# Patient Record
Sex: Male | Born: 1985 | Race: White | Hispanic: No | Marital: Single | Smoking: Current every day smoker
Health system: Southern US, Community
[De-identification: ages and names within clinical notes are randomized; demographics above are authoritative.]

## PROBLEM LIST (undated history)

## (undated) DIAGNOSIS — F419 Anxiety disorder, unspecified: Secondary | ICD-10-CM

---

## 2016-06-10 ENCOUNTER — Emergency Department (HOSPITAL_COMMUNITY)
Admission: EM | Admit: 2016-06-10 | Discharge: 2016-06-10 | Disposition: A | Discharge status: Home / Self Care | Payer: Self-pay | Attending: Emergency Medicine | Admitting: Emergency Medicine

## 2016-06-10 ENCOUNTER — Encounter (HOSPITAL_COMMUNITY): Payer: Self-pay

## 2016-06-10 ENCOUNTER — Emergency Department (HOSPITAL_COMMUNITY): Payer: Self-pay

## 2016-06-10 DIAGNOSIS — F410 Panic disorder [episodic paroxysmal anxiety] without agoraphobia: Secondary | ICD-10-CM | POA: Insufficient documentation

## 2016-06-10 DIAGNOSIS — R072 Precordial pain: Secondary | ICD-10-CM | POA: Insufficient documentation

## 2016-06-10 DIAGNOSIS — F419 Anxiety disorder, unspecified: Secondary | ICD-10-CM

## 2016-06-10 DIAGNOSIS — R079 Chest pain, unspecified: Secondary | ICD-10-CM

## 2016-06-10 DIAGNOSIS — F17200 Nicotine dependence, unspecified, uncomplicated: Secondary | ICD-10-CM | POA: Insufficient documentation

## 2016-06-10 HISTORY — DX: Anxiety disorder, unspecified: F41.9

## 2016-06-10 LAB — BASIC METABOLIC PANEL
Anion gap: 9 (ref 5–15)
BUN: 9 mg/dL (ref 6–20)
CHLORIDE: 106 mmol/L (ref 101–111)
CO2: 25 mmol/L (ref 22–32)
CREATININE: 0.86 mg/dL (ref 0.61–1.24)
Calcium: 9.4 mg/dL (ref 8.9–10.3)
GFR calc Af Amer: >60 mL/min (ref >60)
GFR calc non Af Amer: >60 mL/min (ref >60)
Glucose, Bld: 93 mg/dL (ref 65–99)
POTASSIUM: 4.2 mmol/L (ref 3.5–5.1)
SODIUM: 140 mmol/L (ref 135–145)

## 2016-06-10 LAB — CBC
HEMATOCRIT: 43.7 % (ref 39.0–52.0)
Hemoglobin: 15.7 g/dL (ref 13.0–17.0)
MCH: 29.5 pg (ref 26.0–34.0)
MCHC: 35.9 g/dL (ref 30.0–36.0)
MCV: 82 fL (ref 78.0–100.0)
PLATELETS: 291 10*3/uL (ref 150–400)
RBC: 5.33 MIL/uL (ref 4.22–5.81)
RDW: 12.7 % (ref 11.5–15.5)
WBC: 9.2 10*3/uL (ref 4.0–10.5)

## 2016-06-10 LAB — I-STAT TROPONIN, ED: Troponin i, poc: 0 ng/mL (ref 0.00–0.08)

## 2016-06-10 LAB — D-DIMER, QUANTITATIVE (NOT AT ARMC)

## 2016-06-10 MED ORDER — OMEPRAZOLE 20 MG PO CPDR: 20.0000 mg | DELAYED_RELEASE_CAPSULE | Freq: Every day | ORAL | 0 refills | Status: AC

## 2016-06-10 MED ORDER — MECLIZINE HCL 25 MG PO TABS
25.0000 mg | ORAL_TABLET | Freq: Once | ORAL | Status: AC
  Administered 2016-06-10: 25 mg via ORAL
  Filled 2016-06-10: qty 1

## 2016-06-10 MED ORDER — HYDROXYZINE HCL 10 MG PO TABS: 10.0000 mg | ORAL_TABLET | Freq: Four times a day (QID) | ORAL | 0 refills | Status: AC | PRN

## 2016-06-10 NOTE — Discharge Instructions (Signed)
Substance Abuse Treatment Programs ° °Intensive Outpatient Programs °[facility]     °[address]      °[place], [place]                   °[phone]      ° °[facility] °[address] °[place], [place] °[phone] ° °[facility]     °(Inpatient and outpatient)     °[address]           °[phone]   ° °[facility] °336-288-1484 (Suboxone and Methadone) ° °[address]      °[place], [place] [address]      °336-882-2125      ° °[address] °[place], [place] °852-3033 ° °[facility] (Outpatient/Inpatient, Chemical)    °(insurance only) [phone]      °       °[facility] (Groups & Residential) °[place], [place] °336-389-1413 ° °   °[facility]     °[address]     °[place], [place]      °[phone]      ° °[facility] (for caregivers and family) °[address] °[place], [place] °336-299-4655 ° ° ° ° ° °Residential Treatment Programs °[facility]      °[address], [place], [place] [address]  °([phone]      ° °[facility] °[address] [place], [place] [address] °[phone] ° °[facility]        °336-248-8914      ° °[facility] °1-800-659-3381 ° °[facility]             °[address]                                         °[place], [place]                                                °877-615-2722 or 336-784-9470                              ° °[facility] [place] °[address] °[place], [address] °1.877.941.8954 ° °[facility]    °[address]      °[place]     °[phone]      ° °[facility] °[address] °[place], [place] °336-285-9073 ° °[facility]   °[address]     °[place], [place] [address]     °336-899-1550      °Admissions: 8am-3pm M-F ° °[facility] °[address] °[place],  [place] °336-227-7417 ° °[facility]: Residential Program (90 Days)   °[place], [place]      °336-725-8389 or 800-758-6077    ° °[facility] °[place], [place] °(Walk in Hours over the weekend or by referral) ° °[facility] °[address], [place], [place] [address] °(336) [phone] ° °Crisis Mobile: [facility]:  1-877-626-1772 (for crisis response 24 hours a day) °[facility] Hotline:      1-800-256-2452 °Outpatient Psychiatry and Counseling ° °[facility]: Mobile Crisis   Management 24 hours:  1-877-626-1772 ° °[facility] sliding scale fee and walk in schedule: M-F 8am-12pm/1pm-3pm °[address]  °[place], [place] [address] °[phone] ° °[facility] °[address] °[place], [place] [address] °336-703-9650 ° °[facility] (Former[facility] known [facility])- new patient walk-in appointments available Monday - Friday 8am -3pm.          °[address] °[place], [place] [address] °336-676-6840 or crisis line- 336-676-6905 ° °[facility]/ Intensive Outpatient Therapy Program °[address] °[place], [place] [address] °336-832-9804 ° °[facility]                  °Crisis Services      °336.641.4993      °[address]     °[place], [place] [address]                ° °[facility]   °[facility] °[phone] °[address] °[place], [place] [address] ° ° °[facility]          °[address]  °[place], [place] [address]       °([phone] ° °[facility] °[address] °[place], [place] [address] °336-292-1510 ° °[facility]    °[address]    °[place], [place] [address]     °336-632-3505      ° °Parish McKinney, MD     °[address]     °[place] [address]     °336-282-1251     °  °[facility] °[address] °[place] [address] ° °[facility]     °[address]     °[place], [place]      °336-542-2076      ° °[facility] °Shamsher Ahluwalia, MD °[address] °[place], [place] [address] °336-420-9558 ° °[facility]     °[address]     °[place], [place] [address]     °336-274-1237      ° °[facility] °[address] °[place], [place] [address] °336-854-4450 °Resources for Temporary Residential Assistance/Crisis Centers ° °DAY CENTERS °[facility] °M-F 8am-3pm   °[address] [place], [place] [address]   336-332-0824 °Services include: laundry, barbering, support groups, case management, phone  & computer access, showers, AA/NA mtgs, mental health/substance abuse nurse, job skills class, disability information, VA assistance, spiritual classes, etc.  ° °HOMELESS SHELTERS ° °[facility]     °[facility]   °[address], [place]     °336.271.5959       °       °[facility] (women and children)       °[address] °[place], [place] [address] °336-275-0820 °[email or web address] for application and process °Application Required ° °[facility]   °[address]    °[place] [address]     °336.886.4922       °             °[facility] °[address] °, [place] [address] °336.273.5572 °336-235-0363(schedule application appt.) °Application Required ° °[facility] (women only)    °[address]     °[place],  [address]     °336-884-1039      °  Intake starts 6pm daily °Need valid ID, SSC, & Police report °[facility] [place] °[address] °[place], [place] °336-881-5420 °Application Required ° °[facility] (men only)     °[address]      °[place], [place]     °[phone]      ° °[facility] °(Pregnant women only) °[address] °[place], [place] °336-275-0206 ° °[facility]      °[address]      °[place], [place] [address]     °[phone]      °       °[facility] °[address] °[place], [place] °336-723-1848 °90 day commitment/SA/Application process ° °[facility](men only)     °[address]     °[place], [place]     °336-748-1962       °Check-in at 7pm     °       °[facility] °[address] °[place], [place] [address] °336-248-6684 °Men/Women/Women and Children must be there by 7 pm ° °[facility] °[place], [place] °336-722-8721                ° °

## 2016-06-10 NOTE — ED Triage Notes (Addendum)
Per EMS, pt from home with complaint of left sided nonradiating CP x 3 days that is intermittent. Pt went to [facility] for [facility] yesterday, was discharged and everything was normal. 12 lead NSR. VSS. Does not appear to be anxious.  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

## 2016-06-10 NOTE — ED Provider Notes (Signed)
[facility]-EM[facility] DEPT Provider Note   CSN: [id number] Arrival date & time: 06/10/16  2032     History   Chief Complaint Chief Complaint  Patient presents with  . Panic Attack    HPI Luis Lawrence is a 30 y.o. male.  Luis Lawrence is a 30 y.o. Male who presents to the emergency department complaining of a panic attack. I just saw on discharge as patient earlier today for chest pain. Patient reports he was brought to [name] by ambulance but is staying in [name]. He is provided with a bus pass but the bus that takes him to [name] will not come until the morning. He reports that he began feeling very anxious and check back in. It appears he did the exact same thing at [name] yesterday. Yesterday the patient reported [place] [facility] that he was having suicidal ideations. He had no plan. He was evaluated by psychiatry who felt he was stating this for secondary gain and he was discharged with follow-up with behavioral health. Today the patient only complains of anxiety. He denies any suicidal or homicidal ideations. He denies visual or auditory hallucinations. He denies attempts to harm himself. Patient denies fevers, chest pain, shortness of breath, vomiting, diarrhea, rashes, SI or HI.    The history is provided by the patient and medical records. No language interpreter was used.    Past Medical History:  Diagnosis Date  . Anxiety     There are no active problems to display for this patient.   History reviewed. No pertinent surgical history.     Home Medications    Prior to Admission medications   Medication Sig Start Date End Date Taking? Authorizing Provider  hydrOXYzine (ATARAX/VISTARIL) 10 MG tablet Take 1 tablet (10 mg total) by mouth every 6 (six) hours as needed for anxiety. 06/10/16   [name], PA-C  omeprazole (PRILOSEC) 20 MG capsule Take 1 capsule (20 mg total) by mouth daily. 06/10/16   [name], PA-C    Family History No  family history on file.  Social History Social History  Substance Use Topics  . Smoking status: Current Every Day Smoker    Packs/day: 1.00  . Smokeless tobacco: Not on file  . Alcohol use Yes     Comment: occasionally     Allergies   Ibuprofen   Review of Systems Review of Systems  Constitutional: Negative for chills and fever.  HENT: Negative for congestion and sore throat.   Eyes: Negative for visual disturbance.  Respiratory: Negative for cough and shortness of breath.   Cardiovascular: Negative for chest pain.  Gastrointestinal: Negative for abdominal pain, nausea and vomiting.  Genitourinary: Negative for dysuria.  Musculoskeletal: Negative for neck pain.  Skin: Negative for rash.  Neurological: Negative for headaches.  Psychiatric/Behavioral: Negative for dysphoric mood, hallucinations, self-injury and suicidal ideas. The patient is nervous/anxious.      Physical Exam Updated Vital Signs BP 112/84 (BP Location: Left Arm)   Pulse 62   Temp 98.5 F (36.9 C) (Oral)   Resp 18   Ht 5\' 9"  (1.753 m)   Wt 75 kg   SpO2 97%   BMI 24.43 kg/m   Physical Exam  Constitutional: He appears well-developed and well-nourished. No distress.  HENT:  Head: Normocephalic and atraumatic.  Eyes: Right eye exhibits no discharge. Left eye exhibits no discharge.  Pulmonary/Chest: Effort normal. No respiratory distress.  Neurological: He is alert. Coordination normal.  Skin: No rash noted. He is not  diaphoretic.  Psychiatric: His behavior is normal. His speech is not rapid and/or pressured. He does not exhibit a depressed mood. He expresses no homicidal and no suicidal ideation.  The patient does not appear anxious or depressed. He is resting comfortably in the bed watching TV. He endorses feeling anxious. He denies suicidal or homicidal ideations. He denies visual or auditory hallucinations. He is not disheveled.  Nursing note and vitals reviewed.    ED Treatments / Results    Labs (all labs ordered are listed, but only abnormal results are displayed) Labs Reviewed - No data to display  EKG  EKG Interpretation None       Radiology Dg Chest 2 View  Result Date: 06/10/2016 CLINICAL DATA:  Left sided chest pain and shortness of breath for several days EXAM: CHEST  2 VIEW COMPARISON:  06/09/2016 FINDINGS: The heart size and mediastinal contours are within normal limits. Both lungs are clear. The visualized skeletal structures are unremarkable. IMPRESSION: No active cardiopulmonary disease. Electronically Signed   By: Alcide CleverMark  Lukens M.D.   On: 06/10/2016 18:45    Procedures Procedures (including critical care time)  Medications Ordered in ED Medications  meclizine (ANTIVERT) tablet 25 mg (not administered)     Initial Impression / Assessment and Plan / ED Course  I have reviewed the triage vital signs and the nursing notes.  Pertinent labs & imaging results that were available during my care of the patient were reviewed by me and considered in my medical decision making (see chart for details).  Clinical Course   This is a 30 y.o. Male who presents to the emergency department complaining of a panic attack. I just saw on discharge as patient earlier today for chest pain. Patient [facility] [place] [facility] [place] [facility]. He is provided with a bus pass but [facility] will not come until the morning. He reports that he began feeling very anxious and check back in. It appears he [facility] the exact [facility]. Yesterday the patient reported [facility] that he was having suicidal ideations. He had no plan. He was evaluated by psychiatry who felt he was stating this for secondary gain and he was discharged with follow-up with behavioral health. Today the patient only complains of anxiety. He denies any suicidal or homicidal ideations. He denies visual or auditory  hallucinations. On exam patient is afebrile nontoxic appearing. He endorses anxiety. He does not appear depressed or anxious. He is not disheveled. He denies suicidal or homicidal ideations. Will provide him with some Atarax and discharge him with a short course of Atarax. I encouraged him to follow-up with behavioral health. I provided reassurance. We'll discharge at this time. I advised the patient to follow-up with their primary care provider this week. I advised the patient to return to the emergency department with new or worsening symptoms or new concerns. The patient verbalized understanding and agreement with plan.    Final Clinical Impressions(s) / ED Diagnoses   Final diagnoses:  Panic attack  Anxiety    New Prescriptions New Prescriptions   HYDROXYZINE (ATARAX/VISTARIL) 10 MG TABLET    Take 1 tablet (10 mg total) by mouth every 6 (six) hours as needed for anxiety.     Everlene FarrierWilliam Carmeron Heady, PA-C 06/10/16 [id number]    Arby BarretteMarcy Pfeiffer, MD 06/11/16 [phone]

## 2016-06-10 NOTE — ED Notes (Signed)
Patient undressed, in gown, on monitor, continuous pulse oximetry and blood pressure cuff 

## 2016-06-10 NOTE — ED Notes (Signed)
Called Pt. No response. 

## 2016-06-10 NOTE — ED Notes (Signed)
Pt showing NAD. RR even and unlabored. Voices no questions/concerns at this time. 

## 2016-06-10 NOTE — ED Provider Notes (Signed)
[facility] Provider Note   CSN: [id number] Arrival date & time: 06/10/16  1726     History   Chief Complaint Chief Complaint  Patient presents with  . Chest Pain    HPI Luis Mullethomas Lawrence is a 30 y.o. male.  Luis Mullethomas Hisaw is a 30 y.o. Male who presents to the emergency department complaining of substernal nonradiating chest pain intermittently for the past 3 days. He reports he will just randomly have pain that'll last minutes to seconds and then resolve on their own. It is not worse with exertion. He also reports he's had some intermittent epigastric pain. He denies nausea, vomiting or diarrhea. No previous abdominal surgeries. He reports that he is feeling hungry and would like something to eat. On care everywhere I see [facility] and had negative troponin and delta troponin. He was concerned is experiencing some bilateral foot swelling after a [employer] bus ride from [place]. This has since resolved. Patient denies history of MI, PE or DVT. He is a current smoker. He denies history of hypertension or hyperlipidemia. Patient denies fevers, hemoptysis, nausea, vomiting, diarrhea, rashes, palpitations, leg pain, leg swelling, lightheadedness, dizziness or syncope.   The history is provided by the patient and medical records. No language interpreter was used.  Chest Pain   Associated symptoms include abdominal pain. Pertinent negatives include no back pain, no cough, no dizziness, no fever, no headaches, no nausea, no palpitations, no shortness of breath and no vomiting.    History reviewed. No pertinent past medical history.  There are no active problems to display for this patient.   History reviewed. No pertinent surgical history.     Home Medications    Prior to Admission medications   Medication Sig Start Date End Date Taking? Authorizing Provider  omeprazole (PRILOSEC) 20 MG capsule Take 1 capsule (20 mg total) by mouth daily.  06/10/16   Everlene FarrierWilliam Katye Valek, PA-C    Family History History reviewed. No pertinent family history.  Social History Social History  Substance Use Topics  . Smoking status: Current Every Day Smoker    Packs/day: 1.00  . Smokeless tobacco: Not on file  . Alcohol use Yes     Comment: occasionally     Allergies   Ibuprofen   Review of Systems Review of Systems  Constitutional: Negative for chills and fever.  HENT: Negative for congestion and sore throat.   Eyes: Negative for visual disturbance.  Respiratory: Negative for cough, shortness of breath and wheezing.   Cardiovascular: Positive for chest pain. Negative for palpitations and leg swelling.  Gastrointestinal: Positive for abdominal pain. Negative for diarrhea, nausea and vomiting.  Genitourinary: Negative for dysuria.  Musculoskeletal: Negative for back pain and neck pain.  Skin: Negative for rash.  Neurological: Negative for dizziness, syncope, light-headedness and headaches.     Physical Exam Updated Vital Signs BP 110/73   Pulse 64   Temp 98.3 F (36.8 C) (Oral)   Resp 16   Ht 5\' 9"  (1.753 m)   Wt 77.1 kg   SpO2 98%   BMI 25.10 kg/m   Physical Exam  Constitutional: He is oriented to person, place, and time. He appears well-developed and well-nourished. No distress.  Nontoxic appearing.  HENT:  Head: Normocephalic and atraumatic.  Mouth/Throat: Oropharynx is clear and moist.  Eyes: Conjunctivae are normal. Pupils are equal, round, and reactive to light. Right eye exhibits no discharge. Left eye exhibits no discharge.  Neck: Normal range of motion. Neck  supple. No JVD present.  Cardiovascular: Normal rate, regular rhythm, normal heart sounds and intact distal pulses.  Exam reveals no gallop and no friction rub.   No murmur heard. Bilateral radial, posterior tibialis and dorsalis pedis pulses are intact.    Pulmonary/Chest: Effort normal and breath sounds normal. No respiratory distress. He has no wheezes.  He has no rales. He exhibits no tenderness.  Lungs clear to auscultation bilaterally. No chest wall tenderness to palpation. Symmetric chest expansion bilaterally.  Abdominal: Soft. Bowel sounds are normal. He exhibits no distension and no mass. There is tenderness. There is no guarding.  Abdomen is soft. Bowel sounds are present. Patient has mild epigastric tenderness to palpation. No peritoneal signs.  Musculoskeletal: He exhibits no edema or tenderness.  No lower extremity edema or tenderness.  Lymphadenopathy:    He has no cervical adenopathy.  Neurological: He is alert and oriented to person, place, and time. Coordination normal.  Skin: Skin is warm and dry. Capillary refill takes less than 2 seconds. No rash noted. He is not diaphoretic. No erythema. No pallor.  Psychiatric: He has a normal mood and affect. His behavior is normal.  Nursing note and vitals reviewed.    ED Treatments / Results  Labs (all labs ordered are listed, but only abnormal results are displayed) Labs Reviewed  BASIC METABOLIC PANEL  CBC  D-DIMER, QUANTITATIVE (NOT AT [facility])  Rosezena Sensor, ED    EKG  EKG Interpretation  Date/Time:  Wednesday June 10 2016 17:36:08 EDT Ventricular Rate:  76 PR Interval:    QRS Duration: 94 QT Interval:  356 QTC Calculation: 401 R Axis:   77 Text Interpretation:  Sinus rhythm ST elev, probable normal early repol pattern agree. no STEMI Confirmed by Donnald Garre, MD, Lebron Conners [phone]) on 06/10/2016 7:11:40 PM       Radiology Dg Chest 2 View  Result Date: 06/10/2016 CLINICAL DATA:  Left sided chest pain and shortness of breath for several days EXAM: CHEST  2 VIEW COMPARISON:  06/09/2016 FINDINGS: The heart size and mediastinal contours are within normal limits. Both lungs are clear. The visualized skeletal structures are unremarkable. IMPRESSION: No active cardiopulmonary disease. Electronically Signed   By: Alcide Clever M.D.   On: 06/10/2016 18:45     Procedures Procedures (including critical care time)  Medications Ordered in ED Medications - No data to display   Initial Impression / Assessment and Plan / ED Course  I have reviewed the triage vital signs and the nursing notes.  Pertinent labs & imaging results that were available during my care of the patient were reviewed by me and considered in my medical decision making (see chart for details).  Clinical Course    This is a 30 y.o. Male who presents to the emergency department complaining of substernal nonradiating chest pain intermittently for the past 3 days. He reports he will just randomly have pain that'll last minutes to seconds and then resolve on their own. It is not worse with exertion. He also reports he's had some intermittent epigastric pain. He denies nausea, vomiting or diarrhea. No previous abdominal surgeries. He reports that he is feeling hungry and would like something to eat. On care everywhere I see he was seen at [facility] yesterday and had negative troponin and delta troponin. He was concerned is experiencing some bilateral foot swelling after a [employer] bus ride from [place]. This has since resolved. Patient denies history of MI, PE or DVT. Patient is to be discharged with  recommendation to follow up with PCP in regards to today's hospital visit. Chest pain is not likely of cardiac or pulmonary etiology d/t presentation, perc negative, VSS, no tracheal deviation, no JVD or new murmur, RRR, breath sounds equal bilaterally, EKG without acute abnormalities, negative troponin, negative d-dimer, and negative CXR. I also see had a negative troponin yesterday at [place] [facility]. Patient has tolerated 2 sandwiches without nausea, vomiting or abdominal pain. He reports feeling better. No active chest pain. Pt has been advised start a PPI and return to the ED is CP becomes exertional, associated with diaphoresis or nausea, radiates to left jaw/arm, worsens  or becomes concerning in any way. Pt appears reliable for follow up and is agreeable to discharge. I provided with information for follow-up with the [facility].    Final Clinical Impressions(s) / ED Diagnoses   Final diagnoses:  Nonspecific chest pain    New Prescriptions New Prescriptions   OMEPRAZOLE (PRILOSEC) 20 MG CAPSULE    Take 1 capsule (20 mg total) by mouth daily.     [name], PA-C 06/10/16 1947    Arby Barrette, MD 06/11/16 [phone]

## 2016-06-10 NOTE — ED Triage Notes (Signed)
Pt just discharged an hour ago; pt states he is still having panic attack; no new symtoms noted; pt a&ox 4 on arrival. Pt does not appear anxious in triage; pt sitting quietly on using phone

## 2016-06-11 ENCOUNTER — Encounter (HOSPITAL_COMMUNITY): Payer: Self-pay | Admitting: *Deleted

## 2016-06-11 ENCOUNTER — Emergency Department (HOSPITAL_COMMUNITY)
Admission: EM | Admit: 2016-06-11 | Discharge: 2016-06-12 | Disposition: A | Discharge status: Home / Self Care | Payer: Self-pay | Attending: Emergency Medicine | Admitting: Emergency Medicine

## 2016-06-11 DIAGNOSIS — F1910 Other psychoactive substance abuse, uncomplicated: Secondary | ICD-10-CM | POA: Insufficient documentation

## 2016-06-11 DIAGNOSIS — R45851 Suicidal ideations: Secondary | ICD-10-CM

## 2016-06-11 DIAGNOSIS — F17200 Nicotine dependence, unspecified, uncomplicated: Secondary | ICD-10-CM | POA: Insufficient documentation

## 2016-06-11 DIAGNOSIS — Z79899 Other long term (current) drug therapy: Secondary | ICD-10-CM | POA: Insufficient documentation

## 2016-06-11 DIAGNOSIS — F331 Major depressive disorder, recurrent, moderate: Secondary | ICD-10-CM | POA: Diagnosis present

## 2016-06-11 LAB — RAPID URINE DRUG SCREEN, HOSP PERFORMED
Amphetamines: NOT DETECTED
Barbiturates: NOT DETECTED
Benzodiazepines: NOT DETECTED
Cocaine: NOT DETECTED
Opiates: NOT DETECTED
Tetrahydrocannabinol: NOT DETECTED

## 2016-06-11 LAB — HEPATIC FUNCTION PANEL
ALT: 16 U/L — ABNORMAL LOW (ref 17–63)
AST: 14 U/L — ABNORMAL LOW (ref 15–41)
Albumin: 3.6 g/dL (ref 3.5–5.0)
Alkaline Phosphatase: 72 U/L (ref 38–126)
Bilirubin, Direct: <0.1 mg/dL — ABNORMAL LOW (ref 0.1–0.5)
Total Bilirubin: 0.6 mg/dL (ref 0.3–1.2)
Total Protein: 6.3 g/dL — ABNORMAL LOW (ref 6.5–8.1)

## 2016-06-11 LAB — SALICYLATE LEVEL: Salicylate Lvl: <7 mg/dL (ref 2.8–30.0)

## 2016-06-11 LAB — ACETAMINOPHEN LEVEL

## 2016-06-11 LAB — ETHANOL: Alcohol, Ethyl (B): <5 mg/dL (ref <5)

## 2016-06-11 MED ORDER — ACETAMINOPHEN 325 MG PO TABS: 650.0000 mg | ORAL_TABLET | ORAL | Status: DC | PRN

## 2016-06-11 MED ORDER — LORAZEPAM 1 MG PO TABS: 1.0000 mg | ORAL_TABLET | Freq: Three times a day (TID) | ORAL | Status: DC | PRN

## 2016-06-11 NOTE — BH Assessment (Addendum)
Tele Assessment Note   Luis Lawrence is an 30 y.o. male that denies SI/HI/Pychosis/Substance Abuse.  Patient is a poor historian and he refuses to speaking during the assessment.  Patient would only state that he wants to go home because he does not want to harm himself, others and he is not hearing or seeing things that no one else can hear or see.   Per documentation in the epic chart the patient stated that he wanted to ill himself.  Diagnosis: Major Depressive Diosrder  Past Medical History:  Past Medical History:  Diagnosis Date  . Anxiety     History reviewed. No pertinent surgical history.  Family History: No family history on file.  Social History:  reports that he has been smoking.  He has been smoking about 1.00 pack per day. He has never used smokeless tobacco. He reports that he drinks alcohol. He reports that he does not use drugs.  Additional Social History:  Alcohol / Drug Use History of alcohol / drug use?: No history of alcohol / drug abuse  CIWA: CIWA-Ar BP: (!) 91/53 Pulse Rate: (!) 55 COWS:    PATIENT STRENGTHS: (choose at least two) Average or above average intelligence Capable of independent living Communication skills Physical Health Supportive family/friends  Allergies:  Allergies  Allergen Reactions  . Ibuprofen Nausea And Vomiting    Home Medications:  (Not in a hospital admission)  OB/GYN Status:  No LMP for male patient.  General Assessment Data Location of Assessment: [facility] ED TTS Assessment: In system Is this a Tele or Face-to-Face Assessment?: Tele Assessment Is this an Initial Assessment or a Re-assessment for this encounter?: Initial Assessment Marital status: Single Maiden name: NA Is patient pregnant?: No Pregnancy Status: No Living Arrangements:  (Lives in a room ) Can pt return to current living arrangement?: Yes Admission Status: Voluntary Is patient capable of signing voluntary admission?: Yes Referral Source:  Self/Family/Friend Insurance type: Self Pay  Medical Screening Exam [facility] Walk-in ONLY) Medical Exam completed:  (NA)  Crisis Care Plan Living Arrangements:  (Lives in a room ) Legal Guardian:  (NA) Name of Psychiatrist: Refused to answer the question  Name of Therapist: Refused to answer the question   Education Status Is patient currently in school?: No Current Grade: NA Highest grade of school patient has completed: NA Name of school: NA Contact person: NA  Risk to self with the past 6 months Suicidal Ideation: No Has patient been a risk to self within the past 6 months prior to admission? : No Suicidal Intent: No Has patient had any suicidal intent within the past 6 months prior to admission? : No Is patient at risk for suicide?: No Suicidal Plan?: No-Not Currently/Within Last 6 Months Has patient had any suicidal plan within the past 6 months prior to admission? : No Access to Means: No What has been your use of drugs/alcohol within the last 12 months?: NA Previous Attempts/Gestures: No How many times?: 0 Other Self Harm Risks: NA Triggers for Past Attempts:  (NA) Intentional Self Injurious Behavior: None Family Suicide History: No Recent stressful life event(s):  (None Reported) Persecutory voices/beliefs?: No Depression: Yes Depression Symptoms: Despondent, Feeling angry/irritable, Feeling worthless/self pity Substance abuse history and/or treatment for substance abuse?: No Suicide prevention information given to non-admitted patients: Not applicable  Risk to Others within the past 6 months Homicidal Ideation: No Does patient have any lifetime risk of violence toward others beyond the six months prior to admission? : No Thoughts of Harm  to Others: No Current Homicidal Intent: No Current Homicidal Plan: No Access to Homicidal Means: No Identified Victim: NA History of harm to others?: No Assessment of Violence: None Noted Violent Behavior Description: NA Does  patient have access to weapons?: No Criminal Charges Pending?: No Does patient have a court date: No Is patient on probation?: No  Psychosis Hallucinations: None noted Delusions: None noted  Mental Status Report Appearance/Hygiene: Disheveled Eye Contact: Good Motor Activity: Freedom of movement Speech: Logical/coherent Level of Consciousness: Alert Mood: Depressed, Anxious, Suspicious Affect: Anxious, Depressed, Blunted Anxiety Level: Minimal Thought Processes: Coherent, Relevant Judgement: Unimpaired Orientation: Person, Place, Time, Situation Obsessive Compulsive Thoughts/Behaviors: None  Cognitive Functioning Concentration: Decreased Memory: Recent Intact, Remote Intact IQ: Average Insight: Fair Impulse Control: Fair Appetite: Fair Weight Loss: 0 Weight Gain: 0 Sleep: Decreased Total Hours of Sleep: 7 Vegetative Symptoms: Decreased grooming  ADLScreening [facility] Patient's cognitive ability adequate to safely complete daily activities?: Yes Patient able to express need for assistance with ADLs?: Yes Independently performs ADLs?: Yes (appropriate for developmental age)  Prior Inpatient Therapy Prior Inpatient Therapy: No Prior Therapy Dates: NA Prior Therapy Facilty/Provider(s): NA Reason for Treatment: NA  Prior Outpatient Therapy Prior Outpatient Therapy: No Prior Therapy Dates: NA Prior Therapy Facilty/Provider(s): NA Reason for Treatment: NA Does patient have an ACCT team?: No Does patient have Intensive In-House Services?  : No Does patient have [facility] services? : No Does patient have P4CC services?: No  ADL Screening (condition at time of admission) Patient's cognitive ability adequate to safely complete daily activities?: Yes Is the patient deaf or have difficulty hearing?: No Does the patient have difficulty seeing, even when wearing glasses/contacts?: No Does the patient have difficulty concentrating, remembering, or making  decisions?: No Patient able to express need for assistance with ADLs?: Yes Does the patient have difficulty dressing or bathing?: No Independently performs ADLs?: Yes (appropriate for developmental age) Does the patient have difficulty walking or climbing stairs?: No Weakness of Legs: None Weakness of Arms/Hands: None  Home Assistive Devices/Equipment Home Assistive Devices/Equipment: None    Abuse/Neglect Assessment (Assessment to be complete while patient is alone) Physical Abuse: Denies Verbal Abuse: Denies Sexual Abuse: Denies Exploitation of patient/patient's resources: Denies Self-Neglect: Denies Values / Beliefs Cultural Requests During Hospitalization: None Spiritual Requests During Hospitalization: None Consults Spiritual Care Consult Needed: No Social Work Consult Needed: No Merchant navy officerAdvance Directives (For Healthcare) Does patient have an advance directive?: No Would patient like information on creating an advanced directive?: No - patient declined information    Additional Information 1:1 In Past 12 Months?: No CIRT Risk: No Elopement Risk: No Does patient have medical clearance?: No     Disposition: Per Renata Capriceonrad, NP - patient meets criteria for OBS Unit.  Disposition Initial Assessment Completed for this Encounter: Yes Disposition of Patient: Other dispositions  Linton RumpStevenson, Rivers Hamrick LaVerne 06/11/2016 10:57 AM

## 2016-06-11 NOTE — ED Notes (Signed)
Had pt dress out in purple scrubs with belongings placed in bag.

## 2016-06-11 NOTE — ED Triage Notes (Signed)
Pt reports suicidal ideation onset yesterday with plan to "walk out in front of traffic when I leave here." Pt has been seen twice in the past twelve hours for anxiety and was asleep in the lobby prior to check in this time.

## 2016-06-11 NOTE — ED Provider Notes (Addendum)
  Physical Exam  BP 99/65   Pulse 61   Temp 98.2 F (36.8 C) (Oral)   Resp 16   SpO2 100%   Physical Exam  ED Course  Procedures  MDM Patient initially refusing TTS evaluation. Reviewed records and he has repeatedly stated suicidal thoughts. There was question of secondary gain 20 [facility] or the day before for this. At this point I think with the repeated statements he's making he needs psychiatric evaluation. Patient states he is willing to stay to get it otherwise he'll be involuntary committed.       Luis CoreNathan Miguelangel Korn, MD 06/11/16 [phone]  [facility] Next the patient be a good candidate for observation. However patient is not willing to go over there and he would require involuntary commitment and patient could not go to [facility] if he is involuntary committed. States he wants to be seen and cleared here.    Luis CoreNathan Marli Diego, MD 06/11/16 1021

## 2016-06-11 NOTE — ED Notes (Signed)
Pt given coke and snack at this time.  

## 2016-06-11 NOTE — ED Notes (Addendum)
TTS in process, pt agitated

## 2016-06-11 NOTE — ED Provider Notes (Signed)
[facility]-EMERGENCY DEPT Provider Note   CSN: [id number] Arrival date & time: 06/11/16  0423     History   Chief Complaint Chief Complaint  Patient presents with  . Suicidal    HPI Luis Lawrence is a 30 y.o. male.  The history is provided by the patient.  Mental Health Problem  Presenting symptoms: depression and suicidal thoughts   Degree of incapacity (severity):  Moderate Onset quality:  Gradual Timing:  Constant Progression:  Worsening Chronicity:  New Context: not drug abuse   Relieved by:  Nothing Worsened by:  Nothing Associated symptoms: no abdominal pain, no chest pain and no headaches   Patient is here for suicidal thoughts He reports he has a plan to walk out into traffic He reports he feels depressed He has no other complaints He denies ETOH/drug abuse He denies HA/CP/abdominal pain   Past Medical History:  Diagnosis Date  . Anxiety     There are no active problems to display for this patient.   History reviewed. No pertinent surgical history.     Home Medications    Prior to Admission medications   Medication Sig Start Date End Date Taking? Authorizing Provider  hydrOXYzine (ATARAX/VISTARIL) 10 MG tablet Take 1 tablet (10 mg total) by mouth every 6 (six) hours as needed for anxiety. 06/10/16   [name], PA-C  omeprazole (PRILOSEC) 20 MG capsule Take 1 capsule (20 mg total) by mouth daily. 06/10/16   [name], PA-C    Family History No family history on file.  Social History Social History  Substance Use Topics  . Smoking status: Current Every Day Smoker    Packs/day: 1.00  . Smokeless tobacco: Never Used  . Alcohol use Yes     Comment: occasionally     Allergies   Ibuprofen   Review of Systems Review of Systems  Constitutional: Negative for fever.  Cardiovascular: Negative for chest pain.  Gastrointestinal: Negative for abdominal pain.  Neurological: Negative for headaches.  Psychiatric/Behavioral: Positive for  suicidal ideas.  All other systems reviewed and are negative.    Physical Exam Updated Vital Signs BP 116/75   Pulse (!) 52   Temp 98.2 F (36.8 C) (Oral)   Resp 16   SpO2 98%   Physical Exam CONSTITUTIONAL: Disheveled HEAD: Normocephalic/atraumatic EYES: EOMI/PERRL ENMT: Mucous membranes moist NECK: supple no meningeal signs SPINE/BACK:entire spine nontender CV: S1/S2 noted, no murmurs/rubs/gallops noted LUNGS: Lungs are clear to auscultation bilaterally, no apparent distress ABDOMEN: soft, nontender NEURO: Pt is awake/alert/appropriate, moves all extremitiesx4.  No facial droop.   EXTREMITIES: pulses normal/equal, full ROM SKIN: warm, color normal PSYCH: flat affect   ED Treatments / Results  Labs (all labs ordered are listed, but only abnormal results are displayed) Labs Reviewed  ACETAMINOPHEN LEVEL - Abnormal; Notable for the following:       Result Value   Acetaminophen (Tylenol), Serum <10 (*)    All other components within normal limits  HEPATIC FUNCTION PANEL - Abnormal; Notable for the following:    Total Protein 6.3 (*)    AST 14 (*)    ALT 16 (*)    Bilirubin, Direct <0.1 (*)    All other components within normal limits  ETHANOL  SALICYLATE LEVEL  RAPID URINE DRUG SCREEN, HOSP PERFORMED    EKG  EKG Interpretation None       Radiology  Procedures Procedures (including critical care time)  Medications Ordered in ED Medications - No data to display   Initial Impression /  Assessment and Plan / ED Course  I have reviewed the triage vital signs and the nursing notes.  Pertinent labs results that were available during my care of the patient were reviewed by me and considered in my medical decision making (see chart for details).  Clinical Course    5:39 AM Pt here for repeat visit He is now reporting SI and he reports he will walk into traffic Apparently [facility] and was seen by psych but he states  since he wasn't suicidal he was released Will consult psych here in the ED Pt medically stable at this time 6:44 AM Labs from yesterday and today reviewed - negative Psych consulted Pt stable at this time Final Clinical Impressions(s) / ED Diagnoses   Final diagnoses:  Suicidal thoughts    New Prescriptions New Prescriptions   No medications on file     Zadie Rhineonald Bethanee Redondo, MD 06/11/16 [phone]

## 2016-06-12 DIAGNOSIS — Z791 Long term (current) use of non-steroidal anti-inflammatories (NSAID): Secondary | ICD-10-CM

## 2016-06-12 DIAGNOSIS — F331 Major depressive disorder, recurrent, moderate: Secondary | ICD-10-CM

## 2016-06-12 DIAGNOSIS — Z79899 Other long term (current) drug therapy: Secondary | ICD-10-CM

## 2016-06-12 DIAGNOSIS — F17210 Nicotine dependence, cigarettes, uncomplicated: Secondary | ICD-10-CM

## 2016-06-12 NOTE — ED Notes (Signed)
Pt given new scrub bottoms, size L, as present ones were torn.

## 2016-06-12 NOTE — ED Notes (Signed)
TTS machine placed at bedside.   

## 2016-06-12 NOTE — Discharge Instructions (Signed)
Follow up as per behavioral health and referrals.

## 2016-06-12 NOTE — ED Notes (Signed)
Patient behavior appropriate. Patient acknowledges plan to receive help. Pt euphoric mood and affect presently. Pt given belongings from storage and security. Pt signed all appropriate documentation. Pt ambulatory with steady gait.

## 2016-06-12 NOTE — ED Notes (Signed)
Placed breakfast order: [place] toast w/syrup, sausage, honey nut Cheerios, Coke.

## 2016-06-12 NOTE — ED Notes (Signed)
Patient was given a snack and drink, and a regular diet ordered for lunch. 

## 2016-06-12 NOTE — ED Notes (Signed)
TTS be conducting

## 2016-06-12 NOTE — ED Notes (Signed)
Breakfast tray at bedside 

## 2016-06-12 NOTE — Consult Note (Signed)
Telepsych Consultation   Reason for Consult:  Suicidal ideation with plan  Referring Physician:  EDP Patient Identification: Luis Lawrence Texidor MRN:  [id number] Principal Diagnosis: MDD (major depressive disorder), recurrent episode, moderate (HCC) Diagnosis:   Patient Active Problem List   Diagnosis Date Noted  . MDD (major depressive disorder), recurrent episode, moderate (HCC) [F33.1] 06/12/2016    Total Time spent with patient: 30 minutes  Subjective:   Luis Lawrence Swilling is a 30 y.o. male patient admitted with suicidal ideation with plan to walk out in front of traffic. Pt seen and chart reviewed. Pt is alert/oriented x4, calm, cooperative, and appropriate to situation. Pt denies suicidal/homicidal ideation and psychosis and does not appear to be responding to internal stimuli. He reports that he was not truly suicidal and that he would like to discharge and followup outpatient.   HPI:   Luis Lawrence Kwasnik is an 30 y.o. male that denies SI/HI/Pychosis/Substance Abuse.  Patient is a poor historian and he refuses to speaking during the assessment.  Patient would only state that he wants to go home because he does not want to harm himself, others and he is not hearing or seeing things that no one else can hear or see. Per documentation in the epic chart the patient stated that he wanted to kill himself. He initially refused TTS assessment. He has a high degree of potential secondary gain and [facility] the past couple days for similar complaints with inconsistent statements.  I spoke to this pt via telephone yesterday [facility] he refused to [facility]. Dr. Rubin PayorPickering also attempted to convince him to sign into that unit voluntarily so we could gather objective documentation to support his denial of suicidal ideation, considering his statements of wanting to die 5 hours prior. We explained to the pt that we cannot discharge him so soon after threatening suicide.  Pt was [facility] for stating he was suicidal and contracting for safety when he was not placed inpatient per his request. Pt has not demonstrated self-injurious gestures and his affect is non-congruent. Pt threatened to leave the hospital yesterday    Pt spent the night in the ED without incident. [facility] and he declined. Seen today as above.  Past Psychiatric History: MDD, substance abuse, psychosis  Risk to Self: Suicidal Ideation: No Suicidal Intent: No Is patient at risk for suicide?: No Suicidal Plan?: No-Not Currently/Within Last 6 Months Access to Means: No What has been your use of drugs/alcohol within the last 12 months?: NA How many times?: 0 Other Self Harm Risks: NA Triggers for Past Attempts:  (NA) Intentional Self Injurious Behavior: None Risk to Others: Homicidal Ideation: No Thoughts of Harm to Others: No Current Homicidal Intent: No Current Homicidal Plan: No Access to Homicidal Means: No Identified Victim: NA History of harm to others?: No Assessment of Violence: None Noted Violent Behavior Description: NA Does patient have access to weapons?: No Criminal Charges Pending?: No Does patient have a court date: No Prior Inpatient Therapy: Prior Inpatient Therapy: No Prior Therapy Dates: NA Prior Therapy Facilty/Provider(s): NA Reason for Treatment: NA Prior Outpatient Therapy: Prior Outpatient Therapy: No Prior Therapy Dates: NA Prior Therapy Facilty/Provider(s): NA Reason for Treatment: NA Does patient have an ACCT team?: No Does patient have Intensive In-House Services?  : No Does patient have [facility] services? : No Does patient have P4CC services?: No  Past Medical History:  Past Medical History:  Diagnosis Date  . Anxiety    History reviewed. No pertinent surgical history. Family History: No family history on file. Social History:  History  Alcohol Use  . Yes    Comment:  occasionally     History  Drug Use No    Social History   Social History  . Marital status: Single    Spouse name: N/A  . Number of children: N/A  . Years of education: N/A   Social History Main Topics  . Smoking status: Current Every Day Smoker    Packs/day: 1.00  . Smokeless tobacco: Never Used  . Alcohol use Yes     Comment: occasionally  . Drug use: No  . Sexual activity: Not Asked   Other Topics Concern  . None   Social History Narrative  . None   Additional Social History:    Allergies:   Allergies  Allergen Reactions  . Ibuprofen Nausea And Vomiting    Labs:  Results for orders placed or performed during the hospital encounter of 06/11/16 (from the past 48 hour(s))  Ethanol     Status: None   Collection Time: 06/11/16  5:29 AM  Result Value Ref Range   Alcohol, Ethyl (B) <5 <5 mg/dL    Comment:        LOWEST DETECTABLE LIMIT FOR SERUM ALCOHOL IS 5 mg/dL FOR MEDICAL PURPOSES ONLY   Salicylate level     Status: None   Collection Time: 06/11/16  5:29 AM  Result Value Ref Range   Salicylate Lvl <7.0 2.8 - 30.0 mg/dL  Acetaminophen level     Status: Abnormal   Collection Time: 06/11/16  5:29 AM  Result Value Ref Range   Acetaminophen (Tylenol), Serum <10 (L) 10 - 30 ug/mL    Comment:        THERAPEUTIC CONCENTRATIONS VARY SIGNIFICANTLY. A RANGE OF 10-30 ug/mL MAY BE AN EFFECTIVE CONCENTRATION FOR MANY PATIENTS. HOWEVER, SOME ARE BEST TREATED AT CONCENTRATIONS OUTSIDE THIS RANGE. ACETAMINOPHEN CONCENTRATIONS >150 ug/mL AT 4 HOURS AFTER INGESTION AND >50 ug/mL AT 12 HOURS AFTER INGESTION ARE OFTEN ASSOCIATED WITH TOXIC REACTIONS.   Hepatic function panel     Status: Abnormal   Collection Time: 06/11/16  5:29 AM  Result Value Ref Range   Total Protein 6.3 (L) 6.5 - 8.1 g/dL   Albumin 3.6 3.5 - 5.0 g/dL   AST 14 (L) 15 - 41 U/L   ALT 16 (L) 17 - 63 U/L   Alkaline Phosphatase 72 38 - 126 U/L   Total Bilirubin 0.6 0.3 - 1.2 mg/dL    Bilirubin, Direct <4.0 (L) 0.1 - 0.5 mg/dL   Indirect Bilirubin NOT CALCULATED 0.3 - 0.9 mg/dL  Rapid urine drug screen (hospital performed)     Status: None   Collection Time: 06/11/16  9:22 AM  Result Value Ref Range   Opiates NONE DETECTED NONE DETECTED   Cocaine NONE DETECTED NONE DETECTED   Benzodiazepines NONE DETECTED NONE DETECTED   Amphetamines NONE DETECTED NONE DETECTED   Tetrahydrocannabinol NONE DETECTED NONE DETECTED   Barbiturates NONE DETECTED NONE DETECTED    Comment:        DRUG SCREEN FOR MEDICAL PURPOSES ONLY.  IF CONFIRMATION IS NEEDED FOR ANY PURPOSE, NOTIFY LAB WITHIN 5 DAYS.        LOWEST DETECTABLE LIMITS FOR URINE DRUG SCREEN Drug Class       Cutoff (ng/mL) Amphetamine      1000 Barbiturate  200 Benzodiazepine   200 Tricyclics       300 Opiates          300 Cocaine          300 THC              50     Current Facility-Administered Medications  Medication Dose Route Frequency Provider Last Rate Last Dose  . acetaminophen (TYLENOL) tablet 650 mg  650 mg Oral Q4H PRN Zadie Rhine, MD      . LORazepam (ATIVAN) tablet 1 mg  1 mg Oral Q8H PRN Zadie Rhine, MD       Current Outpatient Prescriptions  Medication Sig Dispense Refill  . hydrOXYzine (ATARAX/VISTARIL) 10 MG tablet Take 1 tablet (10 mg total) by mouth every 6 (six) hours as needed for anxiety. (Patient not taking: Reported on 06/11/2016) 20 tablet 0  . omeprazole (PRILOSEC) 20 MG capsule Take 1 capsule (20 mg total) by mouth daily. (Patient not taking: Reported on 06/11/2016) 30 capsule 0    Musculoskeletal: UTO, camera  Psychiatric Specialty Exam: Physical Exam  Review of Systems  Psychiatric/Behavioral: Positive for depression and substance abuse. Negative for hallucinations and suicidal ideas. The patient is nervous/anxious and has insomnia.   All other systems reviewed and are negative.   BP (!) 105/52 (BP Location: Right Arm)   Pulse (!) 55   Temp 98.2 F (36.8 C) (Oral)  Comment: relieve sitter for lunch s [employer]  Resp 16   SpO2 99%    General Appearance: Casual and Fairly Groomed  Eye Contact:  Good  Speech:  Clear and Coherent and Normal Rate  Volume:  Normal  Mood:  Depressed  Affect:  Congruent and Depressed  Thought Process:  Coherent, Goal Directed and Linear  Orientation:  Full (Time, Place, and Person)  Thought Content:  Discharge plans  Suicidal Thoughts:  No  Homicidal Thoughts:  No  Memory:  Immediate;   Good Recent;   Good Remote;   Fair  Judgement:  Fair  Insight:  Fair  Psychomotor Activity:  Normal  Concentration:  Concentration: Good and Attention Span: Good  Recall:  Good  Fund of Knowledge:  Good  Language:  Good  Akathisia:  No  Handed:  Right  AIMS (if indicated):     Assets:  Communication Skills Desire for Improvement Financial Resources/Insurance Housing Resilience Social Support  ADL's:  Intact  Cognition:  WNL  Sleep:      Treatment Plan Summary: MDD (major depressive disorder), recurrent episode, moderate (HCC) Resources given to patient by [facility] staff RN for local resources for drug treatment, counseling, and shelters.    Disposition: No evidence of imminent risk to self or others at present.   Patient does not meet criteria for psychiatric inpatient admission.  Discharge home.   Beau Fanny, FNP 06/12/2016 10:09 AM

## 2016-06-12 NOTE — ED Notes (Signed)
[facility] spoke to this RN sts patient will be for discharge but needs resource guide for behavioral health and substance abuse counseling.

## 2016-06-12 NOTE — ED Notes (Signed)
Pt left substance abuse treatment information in room. RN went back to waiting room and outside and could not locate patient. Sitter informed RN pt std he did not need that.

## 2016-06-12 NOTE — ED Provider Notes (Signed)
30 y.o. Male polysubstance abuse depression.  Patient with some si on initial evaluation.  Now denies si/hi and psych feels ready for discharge.  Follow up being arranged through SW.    Margarita Grizzleanielle Kabria Hetzer, MD 06/12/16 [phone]

## 2018-05-18 IMAGING — DX DG CHEST 2V
2 series · 2 of 2 positions shown · non-contrast
Comparison: 06/09/2016

CLINICAL DATA: Left sided chest pain and shortness of breath for
several days

EXAM:
CHEST  2 VIEW

[w chest pa]
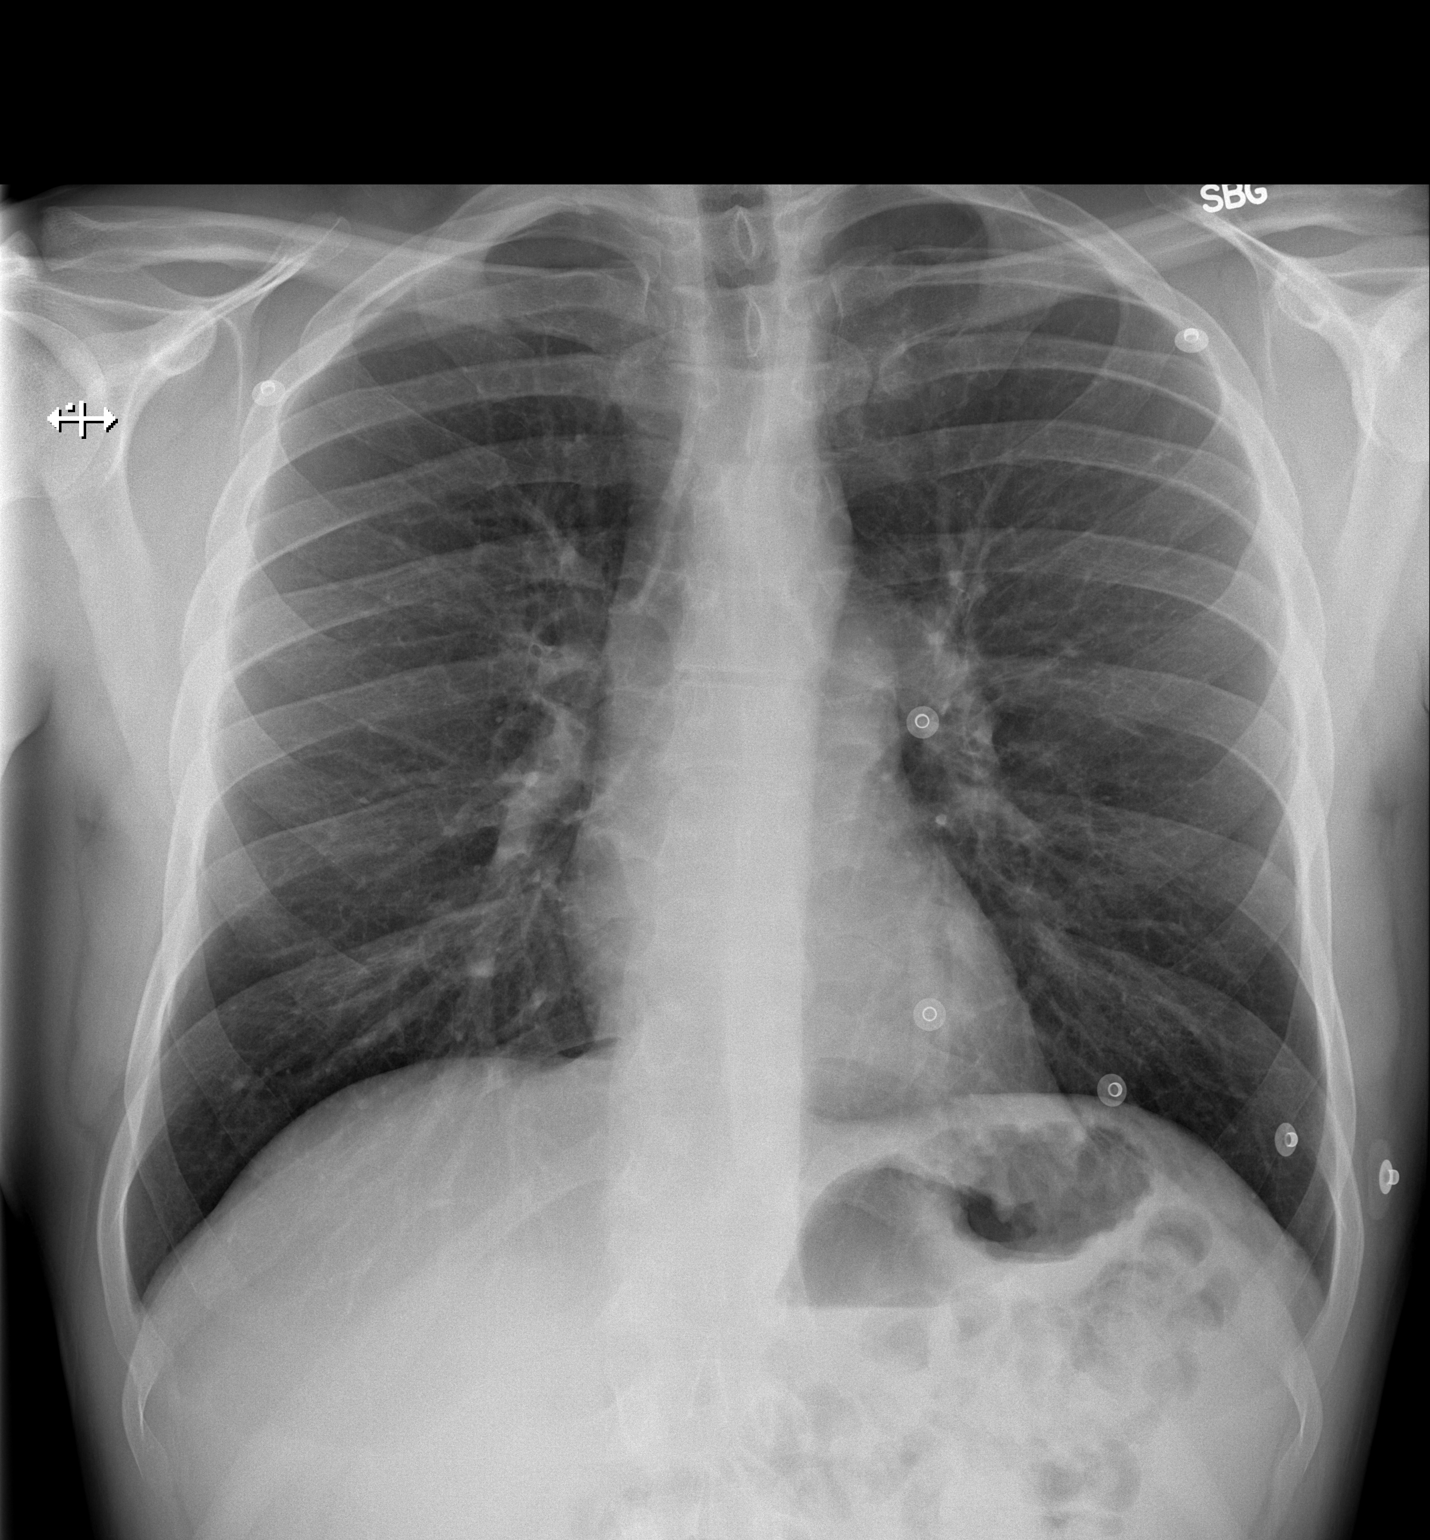

[w chest lat]
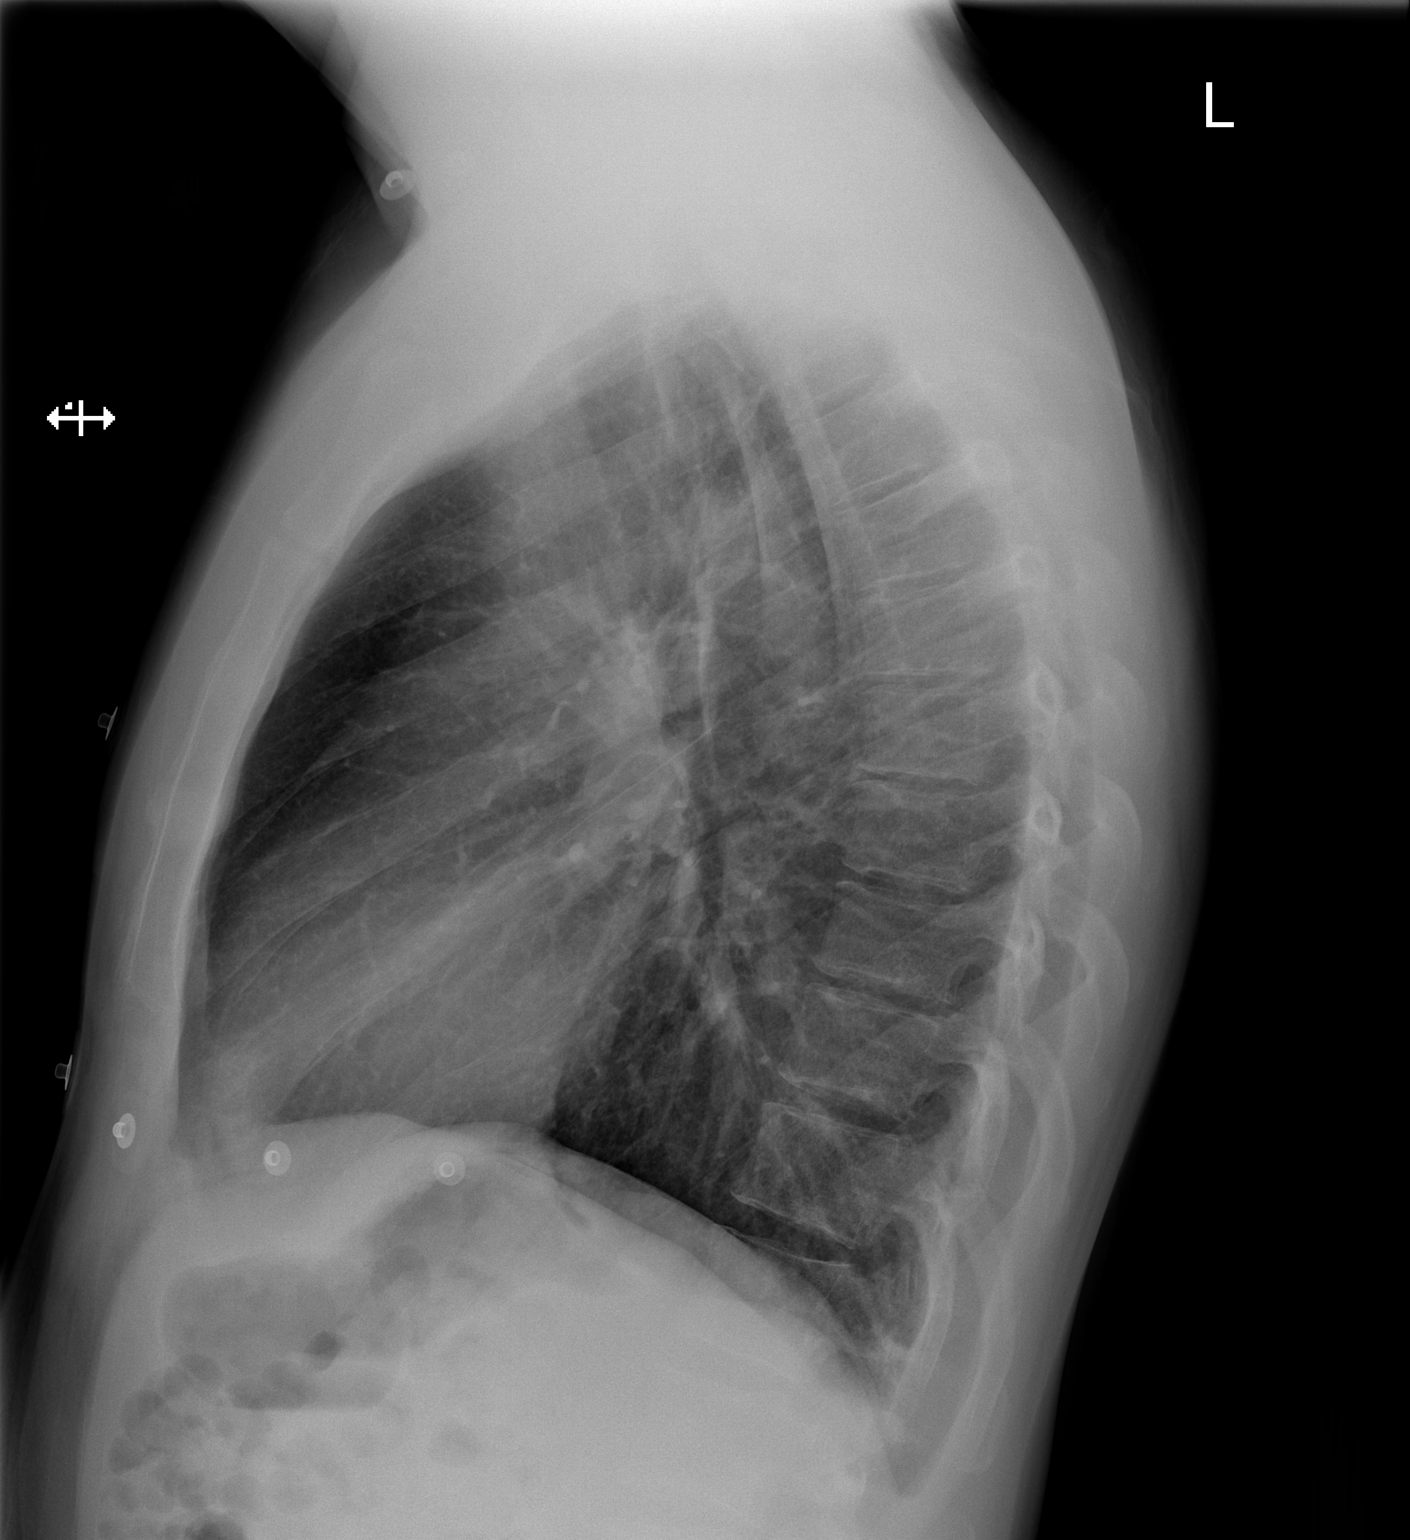

[2 of 2 positions shown; findings below may reference images not displayed]

FINDINGS: The heart size and mediastinal contours are within normal limits.
Both lungs are clear. The visualized skeletal structures are
unremarkable.
IMPRESSION: No active cardiopulmonary disease.
